# Patient Record
Sex: Male | Born: 2000
Health system: Southern US, Community
[De-identification: ages and names within clinical notes are randomized; demographics above are authoritative.]

---
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
## 2020-01-22 ENCOUNTER — Encounter: Payer: Self-pay | Admitting: Family Medicine

## 2020-01-22 ENCOUNTER — Other Ambulatory Visit: Payer: Self-pay

## 2020-01-22 ENCOUNTER — Ambulatory Visit: Payer: BC Managed Care – PPO | Admitting: Family Medicine

## 2020-01-22 VITALS — BP 121/79 | HR 103 | Temp 98.1°F | Resp 16 | Ht 73.0 in | Wt 156.6 lb

## 2020-01-22 DIAGNOSIS — F4322 Adjustment disorder with anxiety: Secondary | ICD-10-CM

## 2020-01-22 DIAGNOSIS — F4001 Agoraphobia with panic disorder: Secondary | ICD-10-CM | POA: Diagnosis not present

## 2020-01-22 NOTE — Progress Notes (Signed)
Office Note 01/22/2020  CC:  Chief Complaint  Patient presents with  . Establish Care    Previously seen w/ [facility]   HPI:  Nathan Morgan is a 19 y.o. male who is here to establish care and discuss anxiety. Patient's most recent primary MD: [facility]. Old records were reviewed prior to or during today's visit.  Pandemic restrictions have really affected him. Was active in sports and with friends a lot before pandemic. Spent last year doing on-line college, nothing in person. Describes chronic worry over a lot of things--more than avg person. He avoids going out with friends.  A bit of social anxiety component but also some things on his own are hard to make himself do it. Still going to work w/out problem. Has some typical mild anxiety about going off to [place] this fall. Not scared of covid virus. When he does push through his anticipatory anxiety he ends up seeing that everything is fine and he enjoys himself. Some mild panic episodes. Denies sadness, hopelessness, or anhedonia. Relationships with family/friends are fine.   His anxiety is not impairing his sleep.   History reviewed. No pertinent past medical history.  History reviewed. No pertinent surgical history.  Family History  Problem Relation Age of Onset  . High blood pressure Father   . High Cholesterol Father   . Cancer Father   . Prostate cancer Maternal Grandfather   . Skin cancer Maternal Grandfather   . Dementia Maternal Grandfather   . Heart disease Paternal Grandmother   . High blood pressure Paternal Grandfather   . High Cholesterol Paternal Grandfather   . Prostate cancer Paternal Grandfather   . Heart disease Paternal Grandfather     Social History   Socioeconomic History  . Marital status: Single    Spouse name: Not on file  . Number of children: Not on file  . Years of education: Not on file  . Highest education level: Not on file  Occupational History  . Not on file  Tobacco  Use  . Smoking status: Never Smoker  . Smokeless tobacco: Never Used  Vaping Use  . Vaping Use: Never used  Substance and Sexual Activity  . Alcohol use: No  . Drug use: No  . Sexual activity: Never  Other Topics Concern  . Not on file  Social History Narrative   Single, no children.   [employer] [place],  2021.  On-campus apartment.   First year at [employer].   Works at [place] [address].   [employer] grad   No tob, alc, no drugs.   Social Determinants of Health   Financial Resource Strain:   . Difficulty of Paying Living Expenses:   Food Insecurity:   . Worried About Programme researcher, broadcasting/film/video in the Last Year:   . Barista in the Last Year:   Transportation Needs:   . Freight forwarder (Medical):   Marland Kitchen Lack of Transportation (Non-Medical):   Physical Activity:   . Days of Exercise per Week:   . Minutes of Exercise per Session:   Stress:   . Feeling of Stress :   Social Connections:   . Frequency of Communication with Friends and Family:   . Frequency of Social Gatherings with Friends and Family:   . Attends Religious Services:   . Active Member of Clubs or Organizations:   . Attends Banker Meetings:   Marland Kitchen Marital Status:   Intimate Partner Violence:   .  Fear of Current or Ex-Partner:   . Emotionally Abused:   Marland Kitchen Physically Abused:   . Sexually Abused:     Outpatient Encounter Medications as of 01/22/2020  Medication Sig  . Probiotic Product (PROBIOTIC PO) Take by mouth daily.   No facility-administered encounter medications on file as of 01/22/2020.    No Known Allergies  ROS Review of Systems  Constitutional: Negative for appetite change, chills, fatigue and fever.  HENT: Negative for congestion, dental problem, ear pain and sore throat.   Eyes: Negative for discharge, redness and visual disturbance.  Respiratory: Negative for cough, chest tightness, shortness of breath and wheezing.   Cardiovascular: Negative for chest pain, palpitations  and leg swelling.  Gastrointestinal: Negative for abdominal pain, blood in stool, diarrhea, nausea and vomiting.  Genitourinary: Negative for difficulty urinating, dysuria, flank pain, frequency, hematuria and urgency.  Musculoskeletal: Negative for arthralgias, back pain, joint swelling, myalgias and neck stiffness.  Skin: Negative for pallor and rash.  Neurological: Negative for dizziness, speech difficulty, weakness and headaches.  Hematological: Negative for adenopathy. Does not bruise/bleed easily.  Psychiatric/Behavioral: Negative for confusion and sleep disturbance. The patient is nervous/anxious.     PE; Blood pressure 121/79, pulse (!) 103, temperature 98.1 F (36.7 C), temperature source Oral, resp. rate 16, height 6\' 1"  (1.854 m), weight 156 lb 9.6 oz (71 kg), SpO2 100 %. Gen: Alert, well appearing.  Patient is oriented to person, place, time, and situation. AFFECT: pleasant, lucid thought and speech. ENT: Ears: EACs clear, normal epithelium.  TMs with good light reflex and landmarks bilaterally.  Eyes: no injection, icteris, swelling, or exudate.  EOMI, PERRLA. Nose: no drainage or turbinate edema/swelling.  No injection or focal lesion.  Mouth: lips without lesion/swelling.  Oral mucosa pink and moist.  Dentition intact and without obvious caries or gingival swelling.  Oropharynx without erythema, exudate, or swelling.  Neck: supple/nontender.  No LAD, mass, or TM.  Carotid pulses 2+ bilaterally, without bruits. CV: RRR, no m/r/g.   LUNGS: CTA bilat, nonlabored resps, good aeration in all lung fields. ABD: soft, NT, ND, BS normal.  No hepatospenomegaly or mass.  No bruits. EXT: no clubbing, cyanosis, or edema.  Musculoskeletal: no joint swelling, erythema, warmth, or tenderness.  ROM of all joints intact. Skin - no sores or suspicious lesions or rashes or color changes   Pertinent labs:  none  ASSESSMENT AND PLAN:   Anxiety/adjustment disorder with anxious mood. Some  periods of near panic. It did him good to come and talk today. Reassured him that what he is going through is understandable. He will see how things go after getting into the school year in and if not improving after a couple weeks then he knows to call and let me know. We discussed possible trial of antidepressant in future.  He is not really interested in seeing a counselor.  I encouraged him to open up and share how he's doing with his mom and dad.  He declined meningococcal vaccine today.  Spent 35 min with pt today reviewing HPI, reviewing relevant past history, doing exam, reviewing and discussing lab and imaging data, and formulating plans.  An After Visit Summary was printed and given to the patient.  No follow-ups on file.  Signed:  [place], MD           01/22/2020
